# Patient Record
Sex: Male | Born: 1980 | Race: White | Hispanic: No | Marital: Married | Smoking: Never smoker
Health system: Southern US, Community
[De-identification: ages and names within clinical notes are randomized; demographics above are authoritative.]

## PROBLEM LIST (undated history)

## (undated) DIAGNOSIS — D689 Coagulation defect, unspecified: Secondary | ICD-10-CM

## (undated) DIAGNOSIS — T7840XA Allergy, unspecified, initial encounter: Secondary | ICD-10-CM

## (undated) DIAGNOSIS — Q120 Congenital cataract: Secondary | ICD-10-CM

## (undated) DIAGNOSIS — H409 Unspecified glaucoma: Secondary | ICD-10-CM

## (undated) HISTORY — DX: Unspecified glaucoma: H40.9

## (undated) HISTORY — DX: Allergy, unspecified, initial encounter: T78.40XA

## (undated) HISTORY — DX: Coagulation defect, unspecified: D68.9

## (undated) HISTORY — PX: EYE SURGERY: SHX253

## (undated) HISTORY — DX: Congenital cataract: Q12.0

---

## 2014-11-08 ENCOUNTER — Ambulatory Visit: Payer: BLUE CROSS/BLUE SHIELD

## 2014-11-08 ENCOUNTER — Ambulatory Visit (INDEPENDENT_AMBULATORY_CARE_PROVIDER_SITE_OTHER): Payer: BLUE CROSS/BLUE SHIELD

## 2014-11-08 ENCOUNTER — Ambulatory Visit (INDEPENDENT_AMBULATORY_CARE_PROVIDER_SITE_OTHER): Payer: BLUE CROSS/BLUE SHIELD | Admitting: Family Medicine

## 2014-11-08 VITALS — BP 132/90 | HR 102 | Temp 98.0°F | Resp 18 | Ht 67.0 in | Wt 240.0 lb

## 2014-11-08 DIAGNOSIS — M25511 Pain in right shoulder: Secondary | ICD-10-CM | POA: Diagnosis not present

## 2014-11-08 DIAGNOSIS — M25521 Pain in right elbow: Secondary | ICD-10-CM

## 2014-11-08 DIAGNOSIS — Q120 Congenital cataract: Secondary | ICD-10-CM | POA: Diagnosis not present

## 2014-11-08 DIAGNOSIS — H540 Blindness, both eyes: Secondary | ICD-10-CM | POA: Diagnosis not present

## 2014-11-08 DIAGNOSIS — T148 Other injury of unspecified body region: Secondary | ICD-10-CM

## 2014-11-08 DIAGNOSIS — M79621 Pain in right upper arm: Secondary | ICD-10-CM

## 2014-11-08 DIAGNOSIS — M25531 Pain in right wrist: Secondary | ICD-10-CM

## 2014-11-08 DIAGNOSIS — T148XXA Other injury of unspecified body region, initial encounter: Secondary | ICD-10-CM

## 2014-11-08 DIAGNOSIS — H547 Unspecified visual loss: Secondary | ICD-10-CM

## 2014-11-08 MED ORDER — OXYCODONE-ACETAMINOPHEN 5-325 MG PO TABS: 1.0000 | ORAL_TABLET | Freq: Three times a day (TID) | ORAL | Status: DC | PRN

## 2014-11-08 MED ORDER — NAPROXEN 500 MG PO TABS: 500.0000 mg | ORAL_TABLET | Freq: Two times a day (BID) | ORAL | Status: DC

## 2014-11-08 MED ORDER — CYCLOBENZAPRINE HCL 5 MG PO TABS: ORAL_TABLET | ORAL | Status: DC

## 2014-11-08 NOTE — Progress Notes (Signed)
Chief Complaint:  Chief Complaint  Patient presents with  . Arm Injury    right    HPI: Wayne Kirk is a 34 y.o. male who is here for  right shoulder, right arm, right elbow, right forearm, right wrist pain since yesterday. 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 He continues to work full-time. He fell Larey Seat over his 41 month old baby carrier which was sitting next to a table at home yesterday and fell on outstretched right arm and hand, he has pain in his right hsoulder. He has 10/10 pain, has tried RICE without relief. 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 He is left-hand dominant. He has pain with minimal movement of his right shoulder arm elbow wrist. He has had vomited at shoulder. He cannot lift his hand above his shoulder height at all. There is some numbness and tingling but it is rather diffuse. He was not planning to go into see the doctor at all until his colleagues stated that his arm was swollen.    Past Medical History  Diagnosis Date  . Allergy   . Clotting disorder   . Glaucoma    Past Surgical History  Procedure Laterality Date  . Eye surgery      30 eye surgeries over lifetime   History   Social History  . Marital Status: Married    Spouse Name: N/A  . Number of Children: N/A  . Years of Education: N/A   Social History Main Topics  . Smoking status: Never Smoker   . Smokeless tobacco: Not on file  . Alcohol Use: Not on file  . Drug Use: Not on file  . Sexual Activity: Not on file   Other Topics Concern  . None   Social History Narrative  . None   Family History  Problem Relation Age of Onset  . Diabetes Father   . Heart disease Father    Allergies  Allergen Reactions  . Morphine And Related Nausea And Vomiting   Prior to Admission medications   Medication Sig Start Date End Date Taking? Authorizing Provider  timolol  (TIMOPTIC-XR) 0.25 % ophthalmic gel-forming 1 drop daily.   Yes Historical Provider, MD     ROS: The patient denies fevers, chills, night sweats, unintentional weight loss, chest pain, palpitations, wheezing, dyspnea on exertion, nausea, vomiting, abdominal pain, dysuria, hematuria, melena, =numbness, weakness, or tingling.  All other systems have been reviewed and were otherwise negative with the exception of those mentioned in the HPI and as above.    PHYSICAL EXAM: Filed Vitals:   11/08/14 1130  BP: 132/90  Pulse: 102  Temp: 98 F (36.7 C)  Resp: 18   Filed Vitals:   11/08/14 1130  Height:  (1.702 m)  Weight: 240 lb (108.863 kg)   Body mass index is 37.58 kg/(m^2).  General: Alert, no acute distress HEENT:  Normocephalic, atraumatic, oropharynx patent. EOMI, PERRLA Cardiovascular:  Regular rate and rhythm, no rubs murmurs or gallops.  No Carotid bruits, radial pulse intact. No pedal edema.  Respiratory: Clear to auscultation bilaterally.  No wheezes, rales, or rhonchi.  No cyanosis, no use of accessory musculature GI: No organomegaly, abdomen is soft and non-tender, positive bowel sounds.  No masses. Skin: No rashes. Neurologic: Facial musculature symmetric. Psychiatric: Patient is appropriate throughout our interaction. Lymphatic: No cervical lymphadenopathy Musculoskeletal:  Gait intact. Neck exam normal Shoulder and arms to not have any deformity. There is no erythema or warmth consistent with a blood clot. Poor shouklder exam due to pain Positive radial pulses, positive grip strength. I cannot ask him to elevate his forearm up to 20 without having pain. It is nearly impossible to do a good exam on his shoulder as well. This is all due to pain.  LABS: No results found for this or any previous visit.   EKG/XRAY:   Primary read interpreted [facility]. Neg for fracture or dislocation, please comment if glenohumeral joint space is widened  No obvious  dislocation   ASSESSMENT/PLAN: Encounter Diagnoses  Name Primary?  . Pain in joint, shoulder region, right Yes  . Pain in joint, upper arm, right   . Elbow pain, right   . Pain in joint, forearm, right   . Right wrist pain   . Sprain and strain    Prescribed Naprosyn, Flexeril, Percocet. He was given a sling for comfort. Follow-up on Friday in my clinic. Patient's work note to be faxed to the following: [phone] Attn to Porfirio Oarean Watt  Gross sideeffects, risk and benefits, and alternatives of medications d/w patient. Patient is aware that all medications have potential sideeffects and we are unable to predict every sideeffect or drug-drug interaction that may occur.  Teryl Gubler PHUONG, DO 11/08/2014 1:39 PM

## 2014-11-09 ENCOUNTER — Encounter: Payer: Self-pay | Admitting: Family Medicine

## 2014-11-12 ENCOUNTER — Ambulatory Visit (INDEPENDENT_AMBULATORY_CARE_PROVIDER_SITE_OTHER): Payer: BLUE CROSS/BLUE SHIELD | Admitting: Family Medicine

## 2014-11-12 ENCOUNTER — Encounter: Payer: Self-pay | Admitting: Family Medicine

## 2014-11-12 VITALS — BP 121/81 | HR 96 | Temp 98.5°F | Resp 16 | Ht 67.0 in | Wt 245.0 lb

## 2014-11-12 DIAGNOSIS — M79601 Pain in right arm: Secondary | ICD-10-CM | POA: Diagnosis not present

## 2014-11-12 DIAGNOSIS — M25521 Pain in right elbow: Secondary | ICD-10-CM

## 2014-11-12 DIAGNOSIS — M79604 Pain in right leg: Secondary | ICD-10-CM | POA: Diagnosis not present

## 2014-11-12 DIAGNOSIS — M25511 Pain in right shoulder: Secondary | ICD-10-CM | POA: Diagnosis not present

## 2014-11-12 DIAGNOSIS — M79621 Pain in right upper arm: Secondary | ICD-10-CM | POA: Diagnosis not present

## 2014-11-12 DIAGNOSIS — T148XXA Other injury of unspecified body region, initial encounter: Secondary | ICD-10-CM

## 2014-11-12 MED ORDER — NAPROXEN 500 MG PO TABS: 500.0000 mg | ORAL_TABLET | Freq: Two times a day (BID) | ORAL | Status: AC

## 2014-11-12 MED ORDER — CYCLOBENZAPRINE HCL 5 MG PO TABS: ORAL_TABLET | ORAL | Status: AC

## 2014-11-12 MED ORDER — OXYCODONE-ACETAMINOPHEN 5-325 MG PO TABS
1.0000 | ORAL_TABLET | Freq: Three times a day (TID) | ORAL | Status: AC | PRN
Start: 2014-11-12 — End: ?

## 2014-11-12 NOTE — Progress Notes (Signed)
Chief Complaint:  Chief Complaint  Patient presents with  . Follow-up  . Arm Pain    right arm    HPI: Wayne Kirk is a 34 y.o. male who is here for right upper arm pain recheck. He is better. He is able to move his shoulder. He is able to move his arm and wrist and hands without much pain. His right upper arm from the shoulder to the elbow continues to bother him some, he rated as a 6 out of 10 sharp pain with certain movements. He has kept his arm in a sling for comfort except for when he showers. It has helped. He has also taken his medications including Naprosyn, Percocet, Flexeril. He denies any numbness or tingling or weakness.  Interestingly he has glaucoma, it is due to congenital cataracts. He recently had a baby and his baby has congenital cataracts as well.   OV from 1 week ago: HPI: Wayne Kirk is a 34 y.o. male who is here for right shoulder, right arm, right elbow, right forearm, right wrist pain since yesterday. 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 He continues to work full-time. He fell Larey SeatFell over his 319 month old baby carrier which was sitting next to a table at home yesterday and fell on outstretched right arm and hand, he has pain in his right hsoulder. He has 10/10 pain, has tried RICE without relief. 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 He is left-hand dominant. He has pain with minimal movement of his right shoulder arm elbow wrist. He has had vomited at shoulder. He cannot lift his hand above his shoulder height at all. There is some numbness and tingling but it is rather diffuse. He was not planning to go into see the doctor at all until his colleagues stated that his arm was swollen.   Past Medical History  Diagnosis Date  . Allergy   . Clotting disorder   . Glaucoma   . Congenital cataract    Past Surgical History  Procedure  Laterality Date  . Eye surgery      30 eye surgeries over lifetime   History   Social History  . Marital Status: Married    Spouse Name: N/A  . Number of Children: N/A  . Years of Education: N/A   Social History Main Topics  . Smoking status: Never Smoker   . Smokeless tobacco: Not on file  . Alcohol Use: Not on file  . Drug Use: Not on file  . Sexual Activity: Not on file   Other Topics Concern  . None   Social History Narrative   Family History  Problem Relation Age of Onset  . Diabetes Father   . Heart disease Father    Allergies  Allergen Reactions  . Morphine And Related Nausea And Vomiting   Prior to Admission medications   Medication Sig Start Date End Date Taking? Authorizing Provider  cyclobenzaprine (FLEXERIL) 5 MG tablet Take 1-2 every 8 hours as needed for muscle spasms 11/08/14  Yes Masiah Woody P Shterna Laramee, DO  naproxen (NAPROSYN) 500 MG tablet Take 1 tablet (500 mg total) by mouth 2 (two) times daily with a meal. No other NSAIDs 11/08/14  Yes Michala Deblanc P Chenelle Benning, DO  oxyCODONE-acetaminophen (ROXICET) 5-325 MG per tablet Take 1 tablet by mouth every 8 (eight) hours as needed for severe  pain. May cause constipation, do not take extra tylenol 11/08/14  Yes Mia Milan P Estie Sproule, DO  timolol (TIMOPTIC-XR) 0.25 % ophthalmic gel-forming 1 drop daily.   Yes Historical Provider, MD     ROS: The patient denies fevers, chills, night sweats, unintentional weight loss, chest pain, palpitations, wheezing, dyspnea on exertion, nausea, vomiting, abdominal pain, dysuria, hematuria, melena, numbness, weakness, or tingling.   All other systems have been reviewed and were otherwise negative with the exception of those mentioned in the HPI and as above.    PHYSICAL EXAM: Filed Vitals:   11/12/14 0905  BP: 121/81  Pulse: 96  Temp: 98.5 F (36.9 C)  Resp: 16   Filed Vitals:   11/12/14 0905  Height:  (1.702 m)  Weight: 245 lb (111.131 kg)   Body mass index is 38.36 kg/(m^2).  General: Alert, no acute  distress HEENT:  Normocephalic, atraumatic, oropharynx patent. EOMI, PERRLA Cardiovascular:  Regular rate and rhythm, no rubs murmurs or gallops.  No Carotid bruits, radial pulse intact. No pedal edema.  Respiratory: Clear to auscultation bilaterally.  No wheezes, rales, or rhonchi.  No cyanosis, no use of accessory musculature GI: No organomegaly, abdomen is soft and non-tender, positive bowel sounds.  No masses. Skin: No rashes. Neurologic: Facial musculature symmetric. Psychiatric: Patient is appropriate throughout our interaction. Lymphatic: No cervical lymphadenopathy Musculoskeletal: Gait intact. Shoulder No deformity, no hypertrophy/atrophy, no erythema, no fluid, no wounds Decrease IR in AROM but he has better PROM.  Nontender at [facility] jt +/- Lift off test, neg Speeds, Neg Hawkins/Neers, normal empty can  5/5 strength, 2/2 triceps and biceps DTRs He is tender along the medial biceps with supination and deep palpation.       LABS: No results found for this or any previous visit.   EKG/XRAY:   Primary read interpreted by Dr. Conley Rolls at [facility].   ASSESSMENT/PLAN: Encounter Diagnoses  Name Primary?  . Musculoskeletal arm pain, right Yes  . Sprain and strain   . Pain in joint, shoulder region, right   . Pain in joint, upper arm, right    We will go ahead and try Wayne Kirk on a trial of returning back to work with full function. If he continues Problems or pain he needs to follow-up sooner otherwise I will see him in 1 week. Refills on Naprosyn, Flexeril, Percocet #20 Work note given to return to work Follow-up in 1 week.  Gross sideeffects, risk and benefits, and alternatives of medications d/w patient. Patient is aware that all medications have potential sideeffects and we are unable to predict every sideeffect or drug-drug interaction that may occur.  Hamilton Capri PHUONG, DO 11/12/2014 3:25 PM

## 2014-11-19 ENCOUNTER — Ambulatory Visit (INDEPENDENT_AMBULATORY_CARE_PROVIDER_SITE_OTHER): Payer: BLUE CROSS/BLUE SHIELD | Admitting: Family Medicine

## 2014-11-19 ENCOUNTER — Encounter: Payer: Self-pay | Admitting: Family Medicine

## 2014-11-19 VITALS — BP 122/88 | HR 70 | Temp 97.7°F | Resp 16 | Ht 67.5 in | Wt 244.0 lb

## 2014-11-19 DIAGNOSIS — M25511 Pain in right shoulder: Secondary | ICD-10-CM | POA: Diagnosis not present

## 2014-11-19 NOTE — Progress Notes (Signed)
Chief Complaint:  Chief Complaint  Patient presents with  . right arm and shoulder follow up    HPI: Wayne Kirk is a 34 y.o. male who is here for recheck of his right shoulder and arm pain. He is 85-90% better. He has been back to work. He has been doing light duty. Again there is no restrictions but he's just been babying the arm and the shoulder. He states that he has pain only with aggressive range of motion and also heavy lifting. He is overall doing better. Please see OV notes from the 2 prior office visits.   HPI: Wayne Kirk is a 34 y.o. male who is here for right upper arm pain recheck. He is better. He is able to move his shoulder. He is able to move his arm and wrist and hands without much pain. His right upper arm from the shoulder to the elbow continues to bother him some, he rated as a 6 out of 10 sharp pain with certain movements. He has kept his arm in a sling for comfort except for when he showers. It has helped. He has also taken his medications including Naprosyn, Percocet, Flexeril. He denies any numbness or tingling or weakness.  Interestingly he has glaucoma, it is due to congenital cataracts. He recently had a baby and his baby has congenital cataracts as well.   OV from 1 week ago: HPI: Wayne Kirk is a 34 y.o. male who is here for right shoulder, right arm, right elbow, right forearm, right wrist pain since yesterday. 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 He continues to work full-time. He fell Larey Seat over his 34 month old baby carrier which was sitting next to a table at home yesterday and fell on outstretched right arm and hand, he has pain in his right hsoulder. He has 10/10 pain, has tried RICE without relief. 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 He is left-hand dominant. He has pain with minimal movement of his right shoulder arm  elbow wrist. He has had vomited at shoulder. He cannot lift his hand above his shoulder height at all. There is some numbness and tingling but it is rather diffuse. He was not planning to go into see the doctor at all until his colleagues stated that his arm was swollen.   Past Medical History  Diagnosis Date  . Allergy   . Clotting disorder   . Glaucoma   . Congenital cataract    Past Surgical History  Procedure Laterality Date  . Eye surgery      30 eye surgeries over lifetime   History   Social History  . Marital Status: Married    Spouse Name: N/A  . Number of Children: N/A  . Years of Education: N/A   Social History Main Topics  . Smoking status: Never Smoker   . Smokeless tobacco: Not on file  . Alcohol Use: Not on file  . Drug Use: Not on file  . Sexual Activity: Not on file   Other Topics Concern  . None   Social History Narrative   Family History  Problem Relation Age of Onset  . Diabetes Father   . Heart disease Father    Allergies  Allergen Reactions  . Morphine And Related Nausea And Vomiting   Prior to Admission medications   Medication Sig Start  Date End Date Taking? Authorizing Provider  cyclobenzaprine (FLEXERIL) 5 MG tablet Take 1-2 every 8 hours as needed for muscle spasms 11/12/14  Yes Saul Fabiano P Lucifer Soja, DO  naproxen (NAPROSYN) 500 MG tablet Take 1 tablet (500 mg total) by mouth 2 (two) times daily with a meal. No other NSAIDs 11/12/14  Yes Vieva Brummitt P Aunna Snooks, DO  oxyCODONE-acetaminophen (ROXICET) 5-325 MG per tablet Take 1 tablet by mouth every 8 (eight) hours as needed for severe pain. May cause constipation, do not take extra tylenol 11/12/14  Yes Shealee Yordy P Dacoda Finlay, DO  timolol (TIMOPTIC-XR) 0.25 % ophthalmic gel-forming 1 drop daily.   Yes Historical Provider, MD     ROS: The patient denies fevers, chills, night sweats, unintentional weight loss, chest pain, palpitations, wheezing, dyspnea on exertion, nausea, vomiting, abdominal pain, dysuria, hematuria, melena,  numbness, weakness, or tingling.   All other systems have been reviewed and were otherwise negative with the exception of those mentioned in the HPI and as above.    PHYSICAL EXAM: Filed Vitals:   11/19/14 0912  BP: 122/88  Pulse: 70  Temp: 97.7 F (36.5 C)  Resp: 16   Filed Vitals:   11/19/14 0912  Height: 5' 7.5" (1.715 m)  Weight: 244 lb (110.678 kg)   Body mass index is 37.63 kg/(m^2).  General: Alert, no acute distress HEENT:  Normocephalic, atraumatic, oropharynx patent. EOMI, PERRLA Cardiovascular:  Regular rate and rhythm, no rubs murmurs or gallops.  No Carotid bruits, radial pulse intact. No pedal edema.  Respiratory: Clear to auscultation bilaterally.  No wheezes, rales, or rhonchi.  No cyanosis, no use of accessory musculature GI: No organomegaly, abdomen is soft and non-tender, positive bowel sounds.  No masses. Skin: No rashes. Neurologic: Facial musculature symmetric. Psychiatric: Patient is appropriate throughout our interaction. Lymphatic: No cervical lymphadenopathy Musculoskeletal: Gait intact. Improved range of motion of his right shoulder and arm. He still has some pain with external rotation. 5 out of 5 grip strength, sensation intact.    LABS: No results found for this or any previous visit.   EKG/XRAY:   Primary read interpreted [facility].   ASSESSMENT/PLAN: Encounter Diagnosis  Name Primary?  . Right shoulder pain Yes    The pleasant 34 year old gentleman with congenital cataracts which developed into glaucoma which caused him to become blind who is here for a right shoulder and arm recheck. Improved right shoulder and arm pain. Follow-up as needed.   Gross sideeffects, risk and benefits, and alternatives of medications d/w patient. Patient is aware that all medications have potential sideeffects and we are unable to predict every sideeffect or drug-drug interaction that may occur.  Breyona Swander PHUONG, DO 11/19/2014 1:58  PM

## 2016-04-18 IMAGING — CR DG HUMERUS 2V *R*
2 series · 2 of 2 positions shown · non-contrast
Comparison: None.

CLINICAL DATA: Upper arm pain, joint pain

EXAM:
RIGHT HUMERUS - 2+ VIEW

[AP]
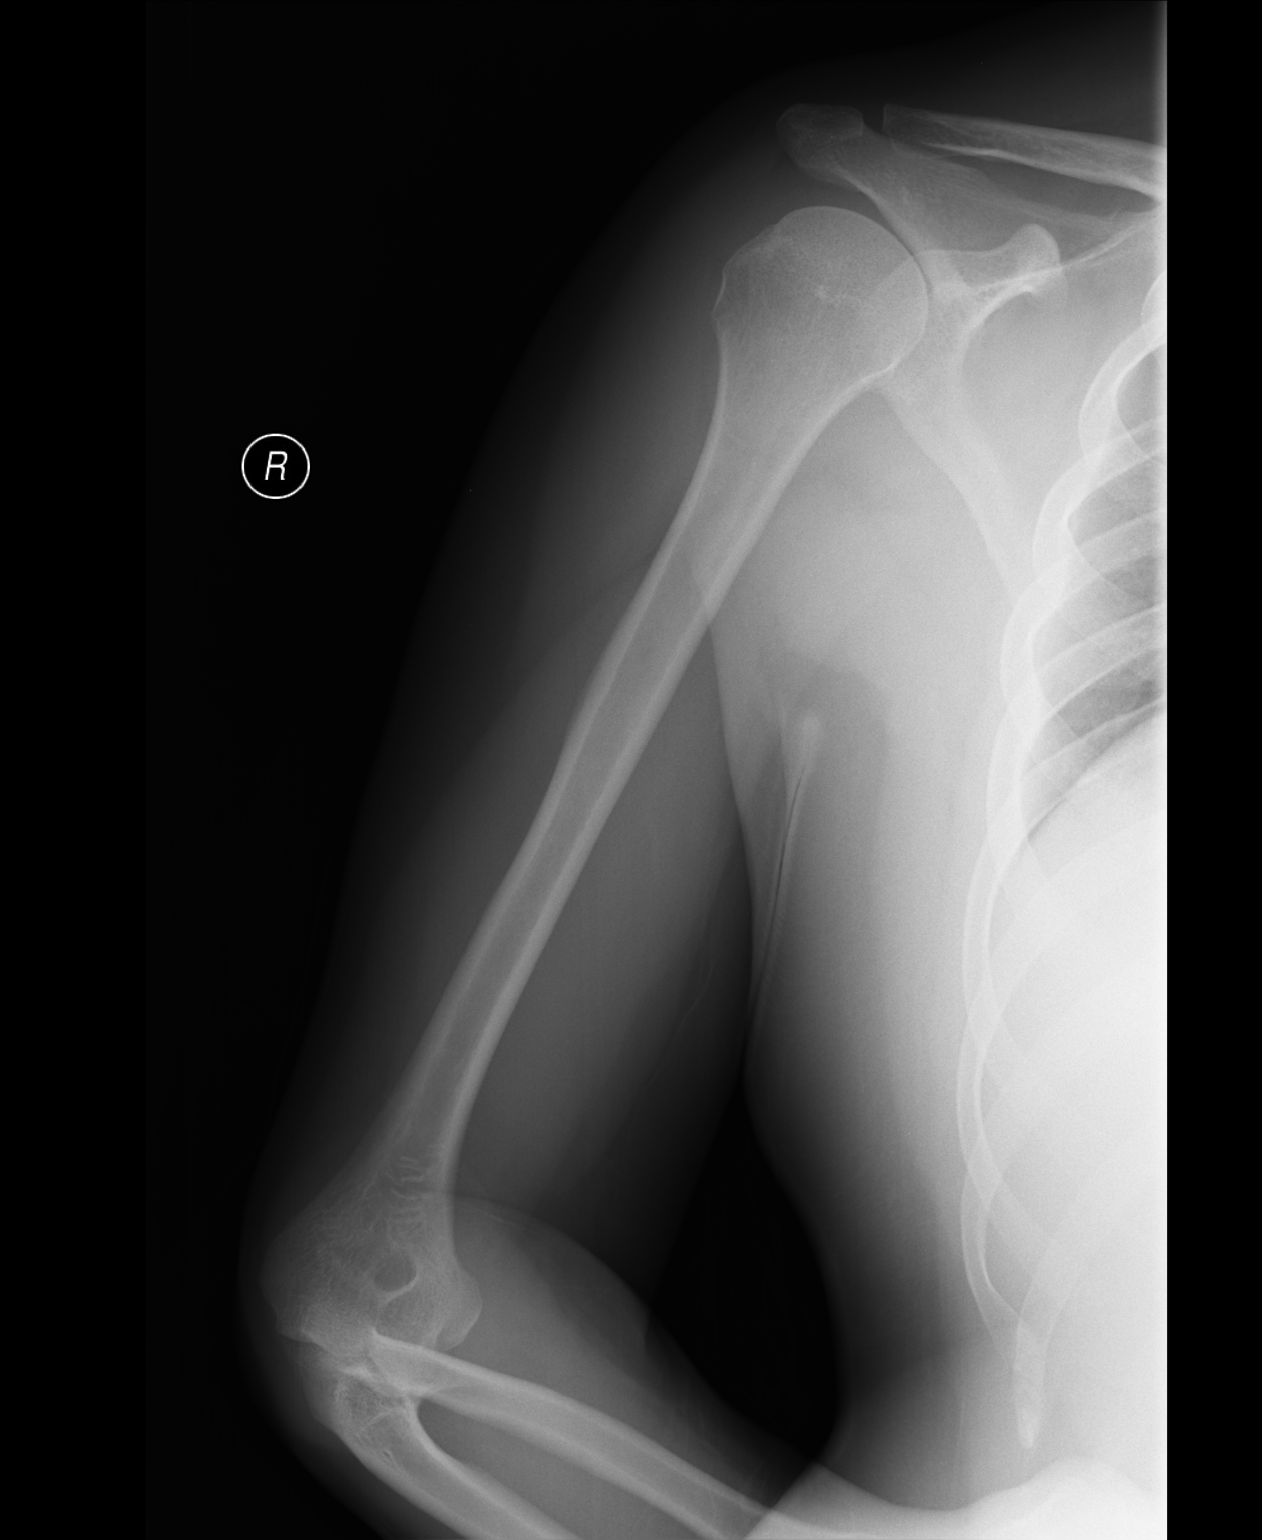

[lateral]
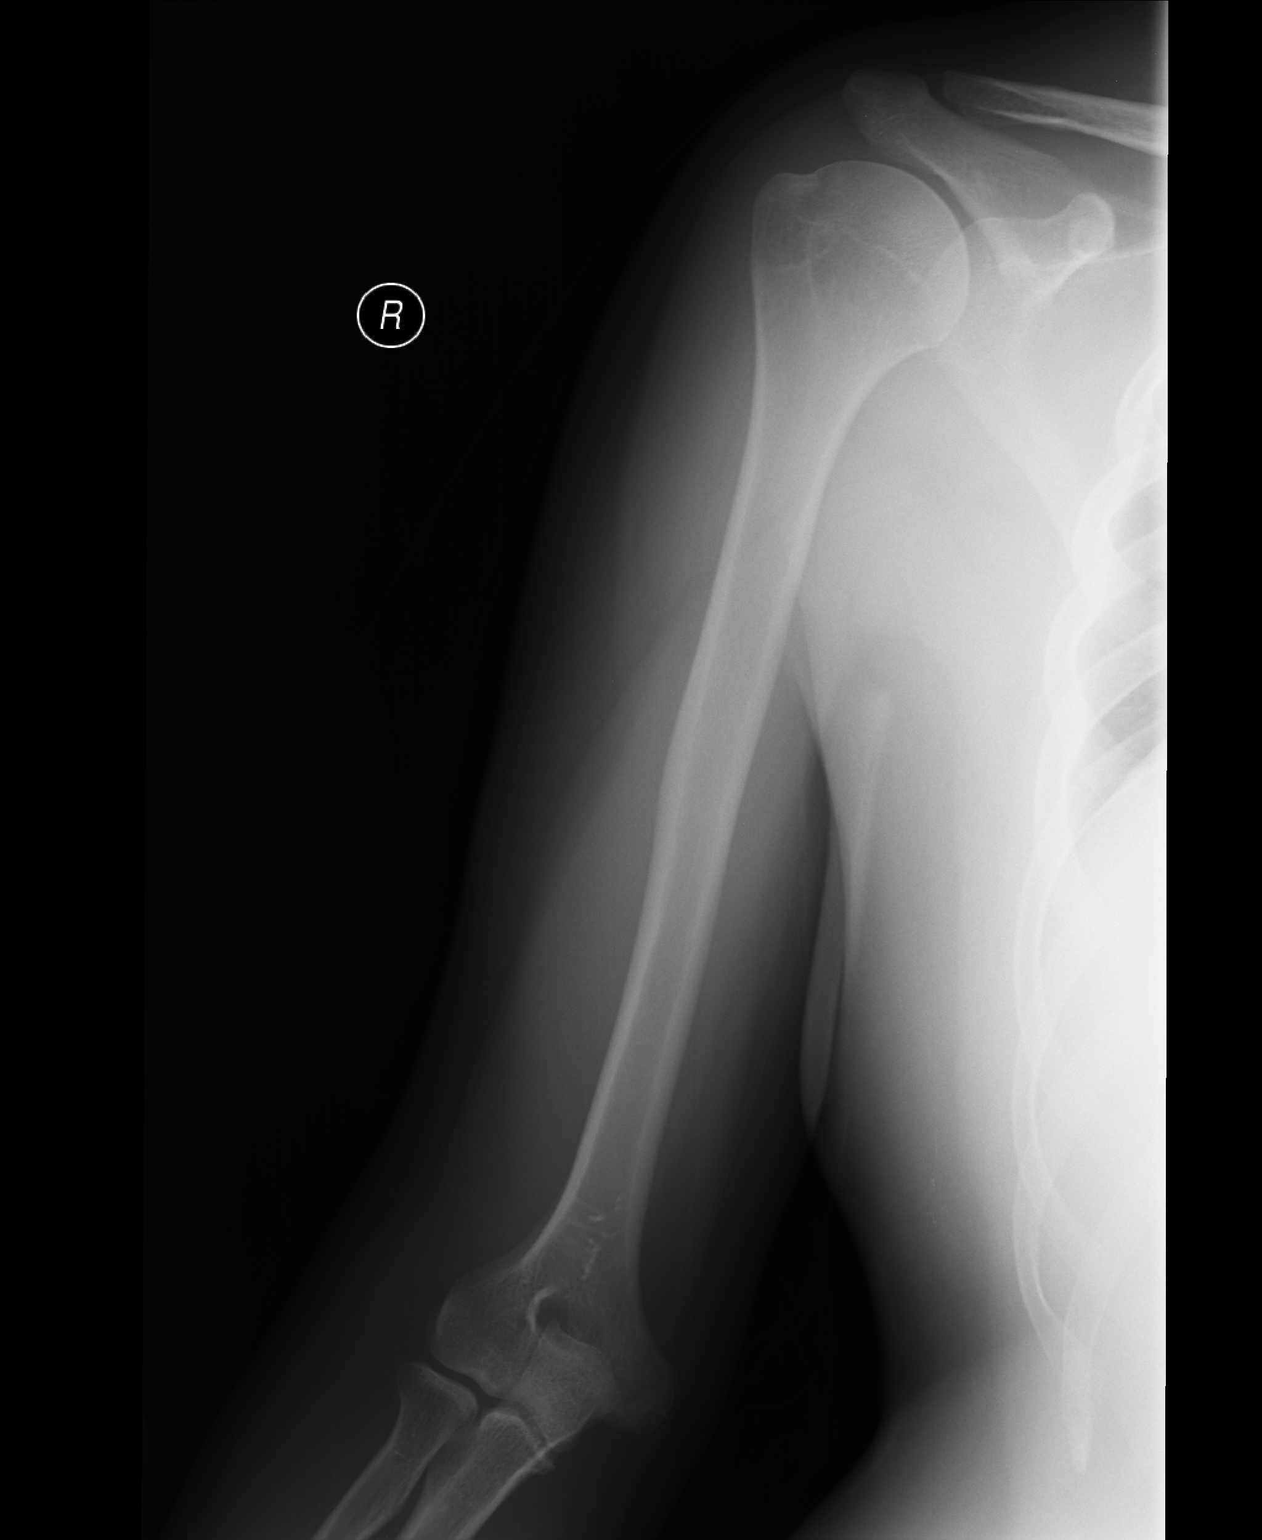

[2 of 2 positions shown; findings below may reference images not displayed]

FINDINGS: There is no evidence of fracture or other focal bone lesions. Soft
tissues are unremarkable.
IMPRESSION: Negative.

## 2016-04-18 IMAGING — CR DG SHOULDER 2+V*R*
2 series · 2 of 2 positions shown · non-contrast
Comparison: None.

CLINICAL DATA: 33-year-old male who fell with pain. Initial
encounter.

EXAM:
RIGHT SHOULDER - 2+ VIEW

[AP (1 of 2)]
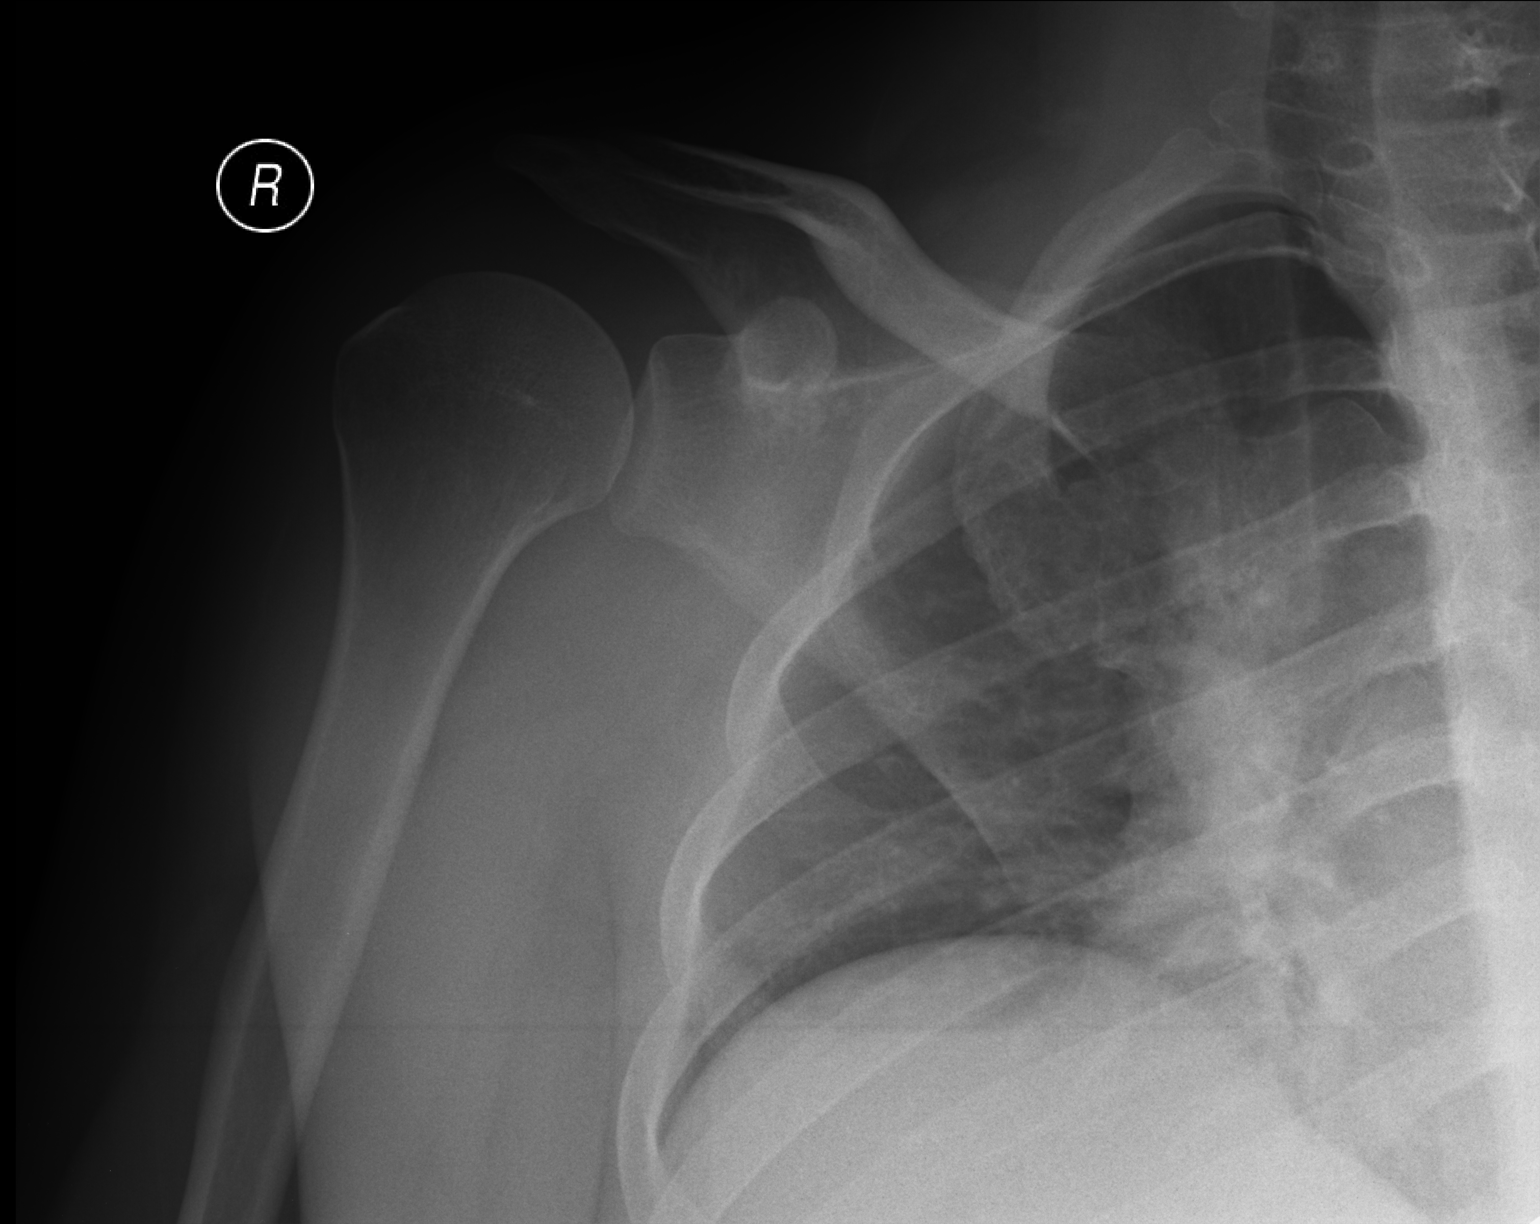

[AP (2 of 2)]
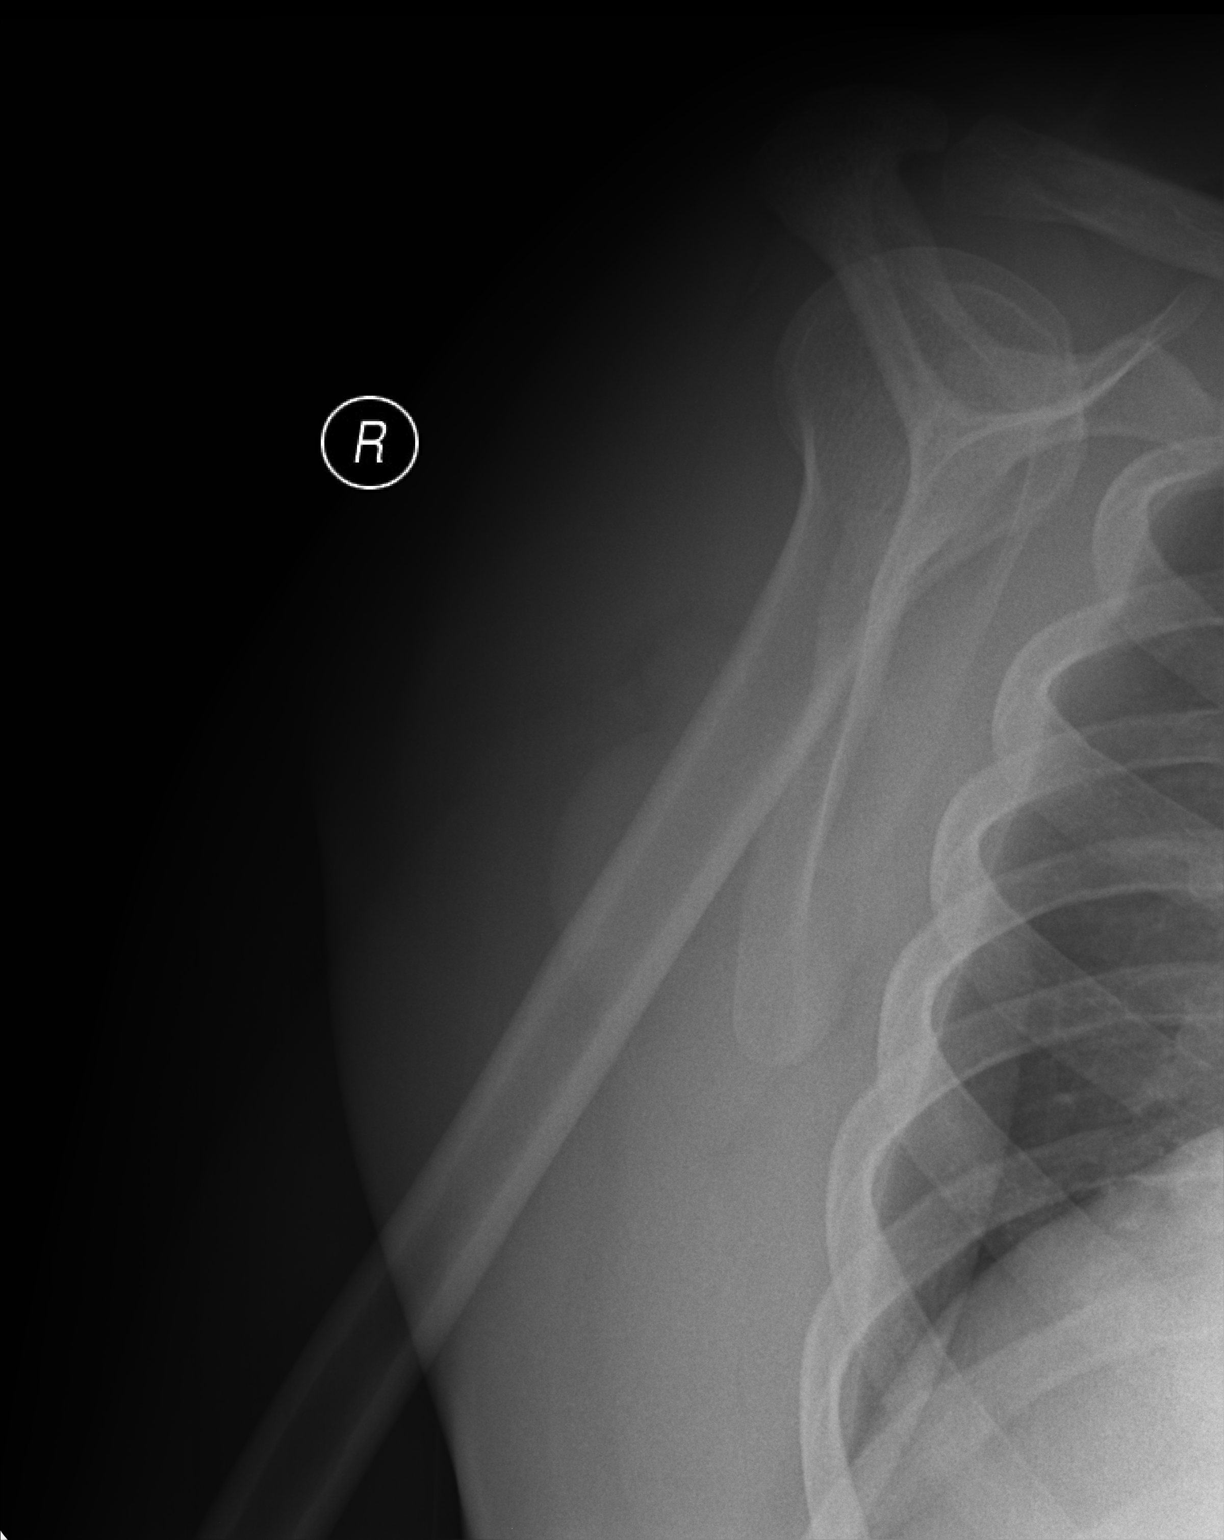

[2 of 2 positions shown; findings below may reference images not displayed]

FINDINGS: No glenohumeral joint dislocation. Bone mineralization is within
normal limits. Proximal right humerus appears intact. Right clavicle
and scapula appear intact. Right glenohumeral joint space appears
normal. Visible right ribs and lung parenchyma within normal limits.
IMPRESSION: Negative radiographic appearance of the right shoulder, normal
glenohumeral joint space.

## 2016-04-18 IMAGING — CR DG FOREARM 2V*R*
2 series · 2 of 2 positions shown · non-contrast
Comparison: None.

CLINICAL DATA: Right forearm pain.  Fall.

EXAM:
RIGHT FOREARM - 2 VIEW

[AP]
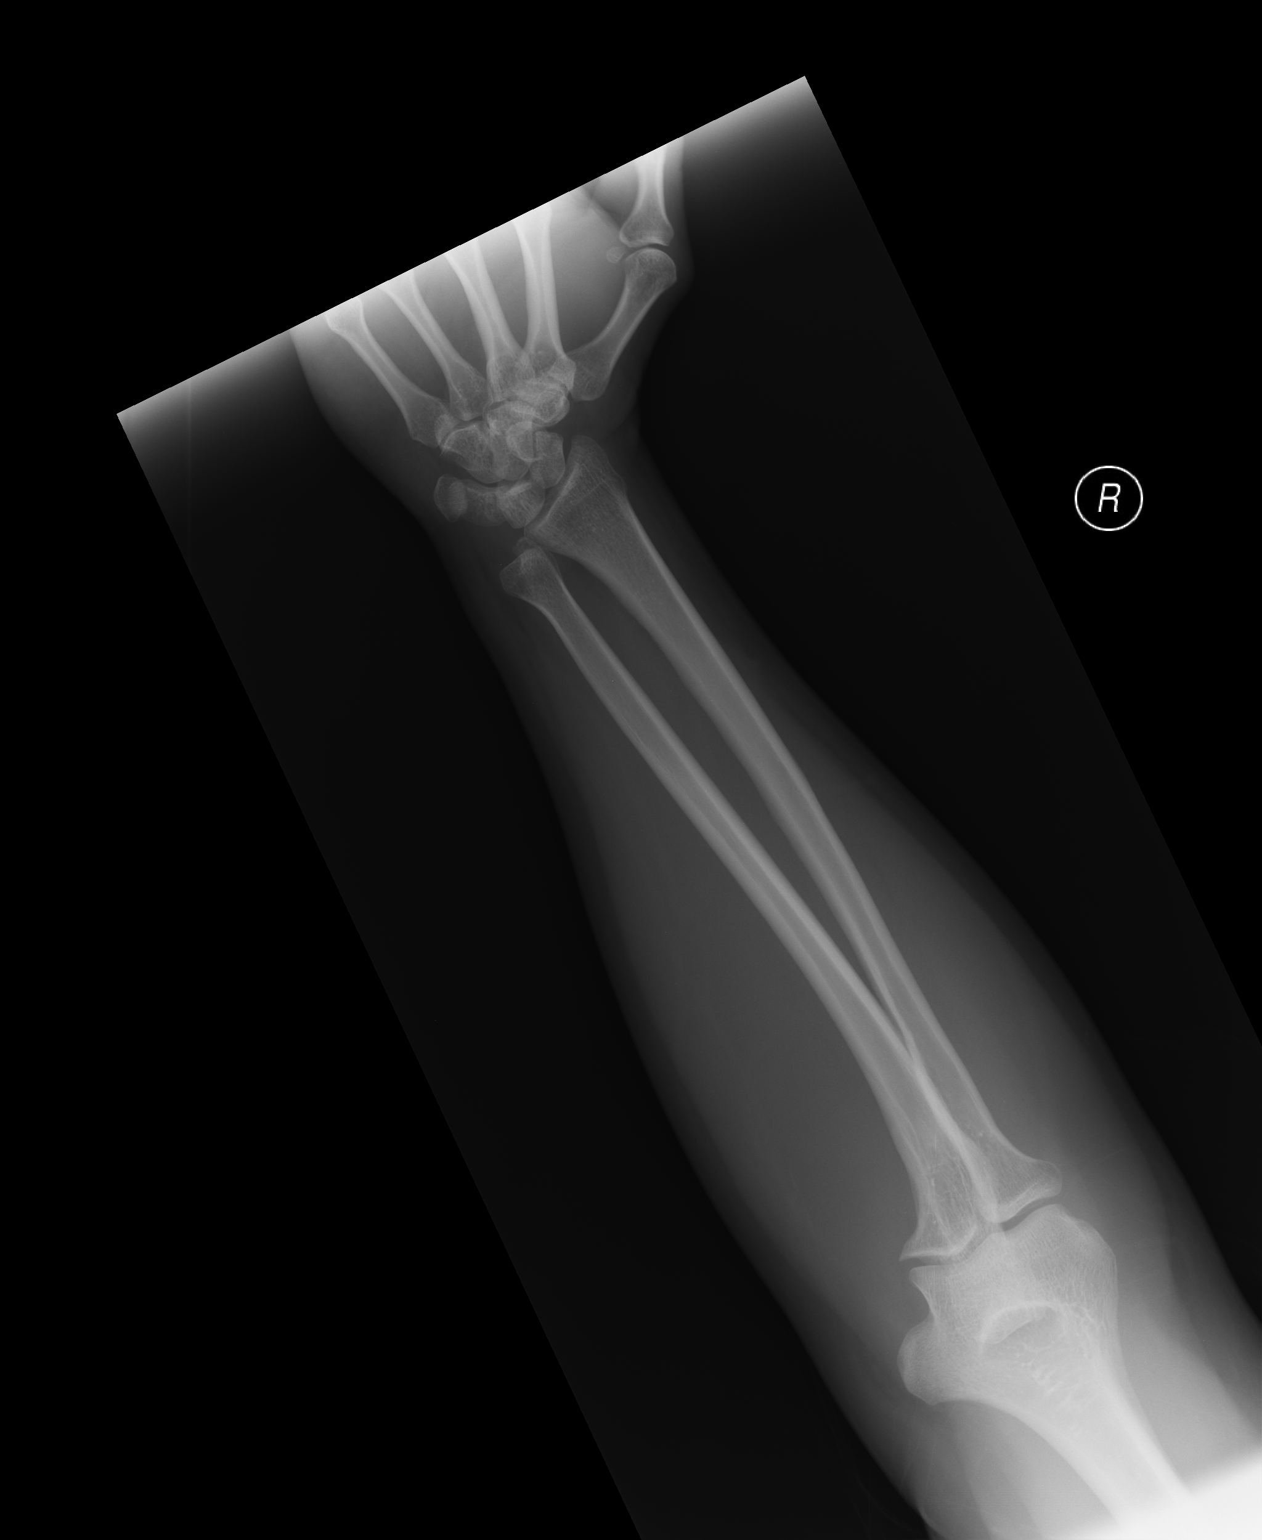

[lateral]
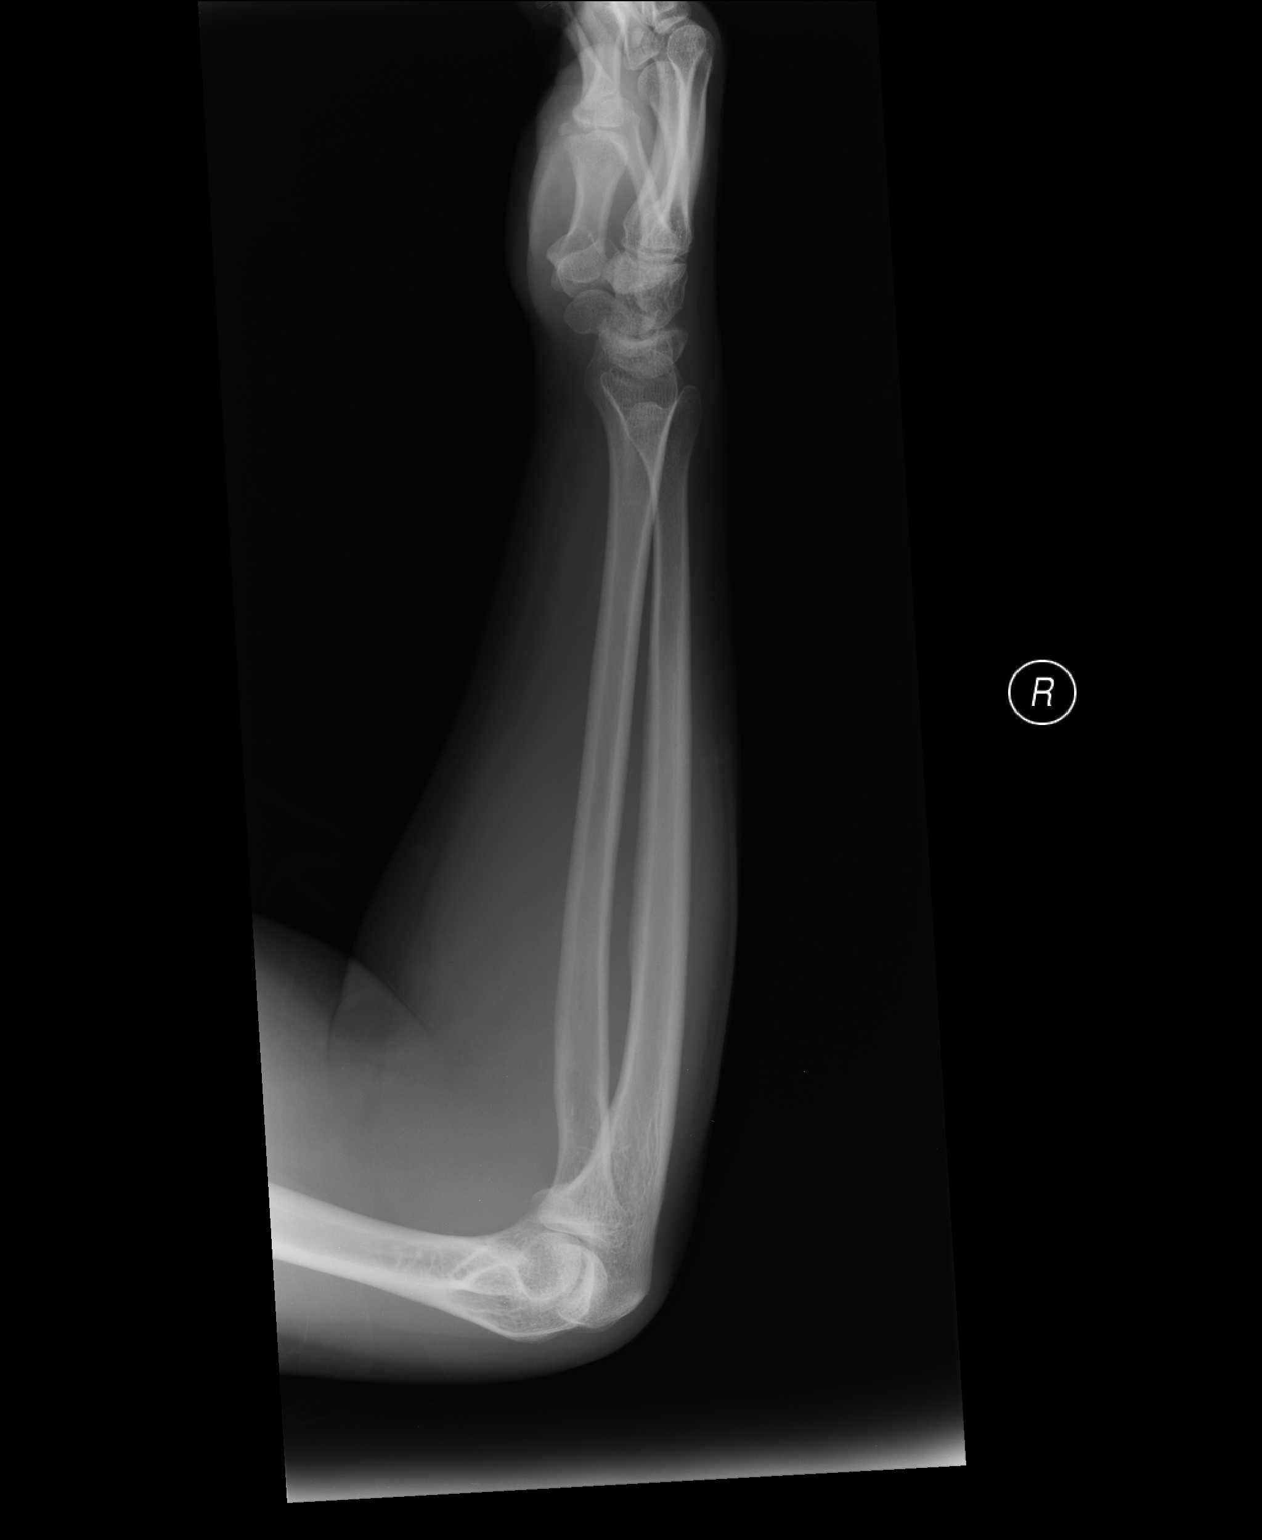

[2 of 2 positions shown; findings below may reference images not displayed]

FINDINGS: There is no evidence of fracture or other focal bone lesions. Soft
tissues are unremarkable.
IMPRESSION: Negative.

## 2019-11-23 ENCOUNTER — Ambulatory Visit: Payer: Self-pay | Attending: Internal Medicine

## 2019-11-23 DIAGNOSIS — Z23 Encounter for immunization: Secondary | ICD-10-CM

## 2019-11-23 NOTE — Progress Notes (Signed)
   Covid-19 Vaccination Clinic  Name:  Wayne Kirk    MRN: [id number] DOB: 09-15-1980  11/23/2019  Mr. Wayne Kirk was observed post Covid-19 immunization for 15 minutes without incident. He was provided with Vaccine Information Sheet and instruction to access the V-Safe system.   Mr. Wayne Kirk was instructed to call 911 with any severe reactions post vaccine: Marland Kitchen Difficulty breathing  . Swelling of face and throat  . A fast heartbeat  . A bad rash all over body  . Dizziness and weakness   Immunizations Administered    Name Date Dose VIS Date Route   Pfizer COVID-19 Vaccine 11/23/2019 11:00 AM 0.3 mL 08/14/2019 Intramuscular   Manufacturer: [employer], [employer]   Lot: [id number]   NDC: [id number]

## 2019-12-21 ENCOUNTER — Ambulatory Visit: Payer: Self-pay | Attending: Internal Medicine

## 2019-12-21 DIAGNOSIS — Z23 Encounter for immunization: Secondary | ICD-10-CM

## 2019-12-21 NOTE — Progress Notes (Signed)
   Covid-19 Vaccination Clinic  Name:  Wayne Kirk    MRN: [id number] DOB: 06/07/1981  12/21/2019  [name] was observed post Covid-19 immunization for 15 minutes without incident. He was provided with Vaccine Information Sheet and instruction to access the V-Safe system.   [name] was instructed to call 911 with any severe reactions post vaccine: Marland Kitchen Difficulty breathing  . Swelling of face and throat  . A fast heartbeat  . A bad rash all over body  . Dizziness and weakness   Immunizations Administered    Name Date Dose VIS Date Route   Pfizer COVID-19 Vaccine 12/21/2019  9:28 AM 0.3 mL 10/28/2018 Intramuscular   Manufacturer: [employer], [employer]   Lot: [id number]   NDC: [address]-6

## 2020-09-01 DIAGNOSIS — Z20828 Contact with and (suspected) exposure to other viral communicable diseases: Secondary | ICD-10-CM | POA: Diagnosis not present

## 2020-09-01 DIAGNOSIS — U071 COVID-19: Secondary | ICD-10-CM | POA: Diagnosis not present

## 2020-09-02 DIAGNOSIS — Z20828 Contact with and (suspected) exposure to other viral communicable diseases: Secondary | ICD-10-CM | POA: Diagnosis not present

## 2020-09-05 DIAGNOSIS — Z20828 Contact with and (suspected) exposure to other viral communicable diseases: Secondary | ICD-10-CM | POA: Diagnosis not present

## 2020-09-06 DIAGNOSIS — Z20828 Contact with and (suspected) exposure to other viral communicable diseases: Secondary | ICD-10-CM | POA: Diagnosis not present

## 2020-09-06 DIAGNOSIS — U071 COVID-19: Secondary | ICD-10-CM | POA: Diagnosis not present

## 2020-09-08 DIAGNOSIS — Z03818 Encounter for observation for suspected exposure to other biological agents ruled out: Secondary | ICD-10-CM | POA: Diagnosis not present

## 2020-09-08 DIAGNOSIS — Z20822 Contact with and (suspected) exposure to covid-19: Secondary | ICD-10-CM | POA: Diagnosis not present

## 2022-05-31 DIAGNOSIS — Z6841 Body Mass Index (BMI) 40.0 and over, adult: Secondary | ICD-10-CM | POA: Diagnosis not present

## 2022-05-31 DIAGNOSIS — Z13220 Encounter for screening for lipoid disorders: Secondary | ICD-10-CM | POA: Diagnosis not present

## 2022-05-31 DIAGNOSIS — R030 Elevated blood-pressure reading, without diagnosis of hypertension: Secondary | ICD-10-CM | POA: Diagnosis not present
